# Patient Record
Sex: Male | Born: 1985 | Race: White | Hispanic: No | Marital: Single | Smoking: Never smoker
Health system: Southern US, Community
[De-identification: ages and names within clinical notes are randomized; demographics above are authoritative.]

## PROBLEM LIST (undated history)

## (undated) DIAGNOSIS — Z8619 Personal history of other infectious and parasitic diseases: Secondary | ICD-10-CM

## (undated) DIAGNOSIS — J45909 Unspecified asthma, uncomplicated: Secondary | ICD-10-CM

## (undated) DIAGNOSIS — K219 Gastro-esophageal reflux disease without esophagitis: Secondary | ICD-10-CM

## (undated) HISTORY — DX: Personal history of other infectious and parasitic diseases: Z86.19

## (undated) HISTORY — DX: Gastro-esophageal reflux disease without esophagitis: K21.9

## (undated) HISTORY — PX: KNEE ARTHROSCOPY: SHX127

## (undated) HISTORY — PX: TONSILLECTOMY AND ADENOIDECTOMY: SUR1326

## (undated) HISTORY — DX: Unspecified asthma, uncomplicated: J45.909

---
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
## 2007-03-08 ENCOUNTER — Emergency Department (HOSPITAL_COMMUNITY): Admission: EM | Admit: 2007-03-08 | Discharge: 2007-03-08 | Payer: Self-pay | Admitting: Emergency Medicine

## 2008-03-30 ENCOUNTER — Ambulatory Visit (HOSPITAL_COMMUNITY): Admission: RE | Admit: 2008-03-30 | Discharge: 2008-03-30 | Payer: Self-pay | Admitting: Family Medicine

## 2009-01-13 ENCOUNTER — Emergency Department (HOSPITAL_COMMUNITY): Admission: EM | Admit: 2009-01-13 | Discharge: 2009-01-13 | Payer: Self-pay | Admitting: Emergency Medicine

## 2009-11-10 ENCOUNTER — Emergency Department (HOSPITAL_COMMUNITY): Admission: EM | Admit: 2009-11-10 | Discharge: 2009-11-10 | Payer: Self-pay | Admitting: Emergency Medicine

## 2012-12-09 ENCOUNTER — Ambulatory Visit (INDEPENDENT_AMBULATORY_CARE_PROVIDER_SITE_OTHER): Payer: 59 | Admitting: Family Medicine

## 2012-12-09 ENCOUNTER — Encounter: Payer: Self-pay | Admitting: Family Medicine

## 2012-12-09 ENCOUNTER — Telehealth: Payer: Self-pay

## 2012-12-09 VITALS — BP 132/88 | HR 90 | Temp 98.3°F | Ht 71.0 in | Wt 246.0 lb

## 2012-12-09 MED ORDER — SULFAMETHOXAZOLE-TRIMETHOPRIM 800-160 MG PO TABS: 2.0000 | ORAL_TABLET | Freq: Two times a day (BID) | ORAL | Status: DC

## 2012-12-09 MED ORDER — CEPHALEXIN 500 MG PO CAPS: 500.0000 mg | ORAL_CAPSULE | Freq: Three times a day (TID) | ORAL | Status: AC

## 2012-12-09 NOTE — Patient Instructions (Addendum)
Start both antibiotics today.   If you have voice change, trouble swallowing, swelling your mouth, fever, sudden increase in pain, or much more swelling on your face, then go to the ER.  Recheck tomorrow afternoon.  Take care.

## 2012-12-09 NOTE — Telephone Encounter (Signed)
Pt already has appt to see Dr Para March today at 2:45 pm. On 12/08/12 pt noticed hard spot size of pen on left upper lip that also became swollen on 12/08/12 about 9:30 am. No gum or mouth swelling. No pain. Pt has iced upper lip. Pt not having any difficulty breathing or swallowing. Pt advised if condition changes or worsens prior to appt pt to call back or go to UC if needed. Dr Para March advised OK to wait for appt unless condition worsens or changes. Pt voiced understanding.

## 2012-12-09 NOTE — Progress Notes (Signed)
L upper lip swelling.  He noted a knot on the L upper lip yesterday, no sx before that.  Since then, progressive swelling on L side of face, some better with ice today.  No internal oral swelling.  Voice and swallowing are normal.  Neck and ears feel fine, no change from baseline.  Not SOB. No wheeze.  No FCNAV.  L side of face is sore at the L upper lip, likely due to edema. No clear trigger.  No known insect bite, new foods.  No new meds. No sx like this prev.  No h/o cold sores or similar.    Meds, vitals, and allergies reviewed.   ROS: See HPI.  Otherwise, noncontributory.  nad Tm wnl Internal nasal exam wnl OP wnl, no internal oral lesions or swelling noted EOMI, PERRL L upper lip puffy from L side to the midline w/o fluctuant mass but small epithelial disruption noted on the L upper side of the lip in his beard, loss of L nasolabial fold noted.  No L eyelid edema but puffy inferior to the L eye.  No fluctuant mass on the face Neck supple, no LA rrr ctab

## 2012-12-10 ENCOUNTER — Encounter: Payer: Self-pay | Admitting: Family Medicine

## 2012-12-10 ENCOUNTER — Ambulatory Visit (INDEPENDENT_AMBULATORY_CARE_PROVIDER_SITE_OTHER): Payer: 59 | Admitting: Family Medicine

## 2012-12-10 VITALS — BP 132/94 | HR 96 | Temp 99.4°F | Wt 246.0 lb

## 2012-12-10 DIAGNOSIS — L0201 Cutaneous abscess of face: Secondary | ICD-10-CM

## 2012-12-10 MED ORDER — CLINDAMYCIN HCL 300 MG PO CAPS: 300.0000 mg | ORAL_CAPSULE | Freq: Four times a day (QID) | ORAL | Status: AC

## 2012-12-10 MED ORDER — PREDNISONE 10 MG PO TABS: ORAL_TABLET | ORAL | Status: AC

## 2012-12-10 MED ORDER — HYDROCODONE-ACETAMINOPHEN 5-325 MG PO TABS: 1.0000 | ORAL_TABLET | Freq: Four times a day (QID) | ORAL | Status: AC | PRN

## 2012-12-10 NOTE — Assessment & Plan Note (Signed)
I talked with Dr. Emeline Darling from ENT.  He didn't think that the patient needed ER eval at this point.  He rec'd pred taper, change to clinda and f/u in his clinic at 9am tomorrow.   This is reasonable.  I was able to express some pus from the lip here in the office; pt was able to tolerate this though it was uncomfortable.   D/w pt and his mother about the med changes and follow up tomorrow with ENT.  They agree.  If any airway sx or decompensation overnight, then to ER. Pt agrees.   I gave him an rx for vicodin for pain.  App ENT help.

## 2012-12-10 NOTE — Progress Notes (Signed)
Recheck from yesterday.  Started on keflex and septra.  No fevers, no airway or intraoral symptoms.  Inc in swelling and pain on upper lips.  Not SOB.  No dysphagia.  His lip started to drain pus today.    Meds, vitals, and allergies reviewed.   ROS: See HPI.  Otherwise, noncontributory.  nad  Tm wnl  Internal nasal exam wnl  OP wnl, no internal oral lesions or swelling noted  EOMI, PERRL  L upper lip more puffy from L side to slightly past the midline w/ fluctuant mass noted- now with small epithelial disruption noted on the L upper side of the lip with puss expressed, loss of L nasolabial fold noted. No L eyelid edema but puffy inferior to the L eye, increased from yesterday.  Neck supple, no LA  rrr  ctab

## 2012-12-10 NOTE — Assessment & Plan Note (Signed)
Presumed, nontoxic. No airway or internal oral lesions.  Would double cover with keflex and septra.  D/w pt.  He agrees.  No indication for I&D currently.  Advised about close f/u tomorrow, to ER if worsening.  He agrees.  Okay for outpatient f/u.

## 2012-12-10 NOTE — Patient Instructions (Addendum)
Stop the septa, start clinda.  Continue the keflex.   Take the prednisone with food. 6-5-4-3-2-1.  Take vicodin for pain.  Be at Dr. Ellyn Hack office tomorrow morning at [facility].  [address] [facility]  [address].

## 2012-12-11 ENCOUNTER — Telehealth: Payer: Self-pay | Admitting: Family Medicine

## 2012-12-11 NOTE — Telephone Encounter (Signed)
Called pt. LMOVM.  Then called his mother.  She advised that pt had I&D this AM at ENT.  Culture collected per report.  I'll await update.  App help of all involved.

## 2014-06-05 ENCOUNTER — Emergency Department: Payer: Self-pay | Admitting: Emergency Medicine

## 2015-01-01 ENCOUNTER — Emergency Department
Admit: 2015-01-01 | Disposition: A | Discharge status: Home / Self Care | Payer: Self-pay | Admitting: Emergency Medicine

## 2021-12-16 ENCOUNTER — Emergency Department (HOSPITAL_COMMUNITY): Payer: BC Managed Care – PPO

## 2021-12-16 ENCOUNTER — Encounter (HOSPITAL_COMMUNITY): Payer: Self-pay | Admitting: *Deleted

## 2021-12-16 ENCOUNTER — Emergency Department (HOSPITAL_COMMUNITY)
Admission: EM | Admit: 2021-12-16 | Discharge: 2021-12-17 | Disposition: A | Discharge status: Home / Self Care | Payer: BC Managed Care – PPO | Attending: Emergency Medicine | Admitting: Emergency Medicine

## 2021-12-16 DIAGNOSIS — Y92410 Unspecified street and highway as the place of occurrence of the external cause: Secondary | ICD-10-CM | POA: Diagnosis not present

## 2021-12-16 DIAGNOSIS — S0990XA Unspecified injury of head, initial encounter: Secondary | ICD-10-CM | POA: Diagnosis present

## 2021-12-16 DIAGNOSIS — S060X0A Concussion without loss of consciousness, initial encounter: Secondary | ICD-10-CM | POA: Diagnosis not present

## 2021-12-16 MED ORDER — ACETAMINOPHEN 325 MG PO TABS
650.0000 mg | ORAL_TABLET | Freq: Once | ORAL | Status: AC
  Administered 2021-12-16: 650 mg via ORAL
  Filled 2021-12-16: qty 2

## 2021-12-16 NOTE — ED Triage Notes (Signed)
Patient falls asleep but is aroused easily ?
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
## 2021-12-16 NOTE — Discharge Instructions (Addendum)
Take Tylenol or Motrin for pain and follow-up with your doctor for the same problem.  Your blood pressure was mildly elevated.  You should get that rechecked in the next couple weeks ?

## 2021-12-16 NOTE — ED Notes (Signed)
Pt ambulated to BR with steady gait; no difficulty noted or reported ?
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
